# Patient Record
Sex: Female | Born: 1965
Health system: Southern US, Community
[De-identification: ages and names within clinical notes are randomized; demographics above are authoritative.]

## PROBLEM LIST (undated history)

## (undated) DIAGNOSIS — E119 Type 2 diabetes mellitus without complications: Secondary | ICD-10-CM

## (undated) DIAGNOSIS — I10 Essential (primary) hypertension: Secondary | ICD-10-CM

---

## 1999-01-05 ENCOUNTER — Other Ambulatory Visit: Admission: RE | Admit: 1999-01-05 | Discharge: 1999-01-05 | Payer: Self-pay | Admitting: Gynecology

## 2000-05-05 ENCOUNTER — Other Ambulatory Visit: Admission: RE | Admit: 2000-05-05 | Discharge: 2000-05-05 | Payer: Self-pay | Admitting: Family Medicine

## 2001-10-09 ENCOUNTER — Encounter: Payer: Self-pay | Admitting: Emergency Medicine

## 2001-10-09 ENCOUNTER — Inpatient Hospital Stay (HOSPITAL_COMMUNITY): Admission: EM | Admit: 2001-10-09 | Discharge: 2001-10-24 | Payer: Self-pay | Admitting: *Deleted

## 2001-10-09 ENCOUNTER — Encounter: Payer: Self-pay | Admitting: Neurosurgery

## 2001-10-10 ENCOUNTER — Encounter: Payer: Self-pay | Admitting: Neurosurgery

## 2001-10-15 ENCOUNTER — Encounter: Payer: Self-pay | Admitting: Neurosurgery

## 2001-10-22 ENCOUNTER — Encounter: Payer: Self-pay | Admitting: Neurosurgery

## 2002-01-05 ENCOUNTER — Encounter: Payer: Self-pay | Admitting: Neurosurgery

## 2002-01-05 ENCOUNTER — Encounter: Admission: RE | Admit: 2002-01-05 | Discharge: 2002-01-05 | Payer: Self-pay | Admitting: Neurosurgery

## 2002-01-19 ENCOUNTER — Inpatient Hospital Stay (HOSPITAL_COMMUNITY): Admission: RE | Admit: 2002-01-19 | Discharge: 2002-01-22 | Payer: Self-pay | Admitting: Neurosurgery

## 2002-01-19 ENCOUNTER — Encounter: Payer: Self-pay | Admitting: Neurosurgery

## 2002-01-20 ENCOUNTER — Encounter: Payer: Self-pay | Admitting: Neurosurgery

## 2005-05-29 ENCOUNTER — Other Ambulatory Visit: Admission: RE | Admit: 2005-05-29 | Discharge: 2005-05-29 | Payer: Self-pay | Admitting: Gynecology

## 2005-10-24 ENCOUNTER — Ambulatory Visit (HOSPITAL_BASED_OUTPATIENT_CLINIC_OR_DEPARTMENT_OTHER): Admission: RE | Admit: 2005-10-24 | Discharge: 2005-10-24 | Payer: Self-pay | Admitting: Gynecology

## 2013-09-23 ENCOUNTER — Encounter (HOSPITAL_COMMUNITY): Payer: Self-pay | Admitting: Emergency Medicine

## 2013-09-23 ENCOUNTER — Emergency Department (HOSPITAL_COMMUNITY)
Admission: EM | Admit: 2013-09-23 | Discharge: 2013-09-23 | Disposition: A | Discharge status: Home / Self Care | Payer: 59 | Source: Home / Self Care | Attending: Family Medicine | Admitting: Family Medicine

## 2013-09-23 DIAGNOSIS — S139XXA Sprain of joints and ligaments of unspecified parts of neck, initial encounter: Secondary | ICD-10-CM

## 2013-09-23 DIAGNOSIS — S161XXA Strain of muscle, fascia and tendon at neck level, initial encounter: Secondary | ICD-10-CM

## 2013-09-23 MED ORDER — METHOCARBAMOL 500 MG PO TABS: 500.0000 mg | ORAL_TABLET | Freq: Two times a day (BID) | ORAL | Status: DC

## 2013-09-23 MED ORDER — MELOXICAM 7.5 MG PO TABS: 7.5000 mg | ORAL_TABLET | Freq: Two times a day (BID) | ORAL | Status: DC

## 2013-09-23 NOTE — ED Notes (Addendum)
C/o neck pain radiating to bilateral shoulders and arms.  Patient job does consistent of looking down while standing.  Patient went to urgent care on [address] and received muscle relaxer which did not work.

## 2013-09-23 NOTE — ED Provider Notes (Signed)
CSN: [id number]     Arrival date & time 09/23/13  1914 History   First MD Initiated Contact with Patient 09/23/13 1956     Chief Complaint  Patient presents with  . Neck Pain   (Consider location/radiation/quality/duration/timing/severity/associated sxs/prior Treatment) Patient is a 47 y.o. female presenting with neck pain. The history is provided by the patient and the spouse.  Neck Pain Pain location:  Occipital region Quality:  Burning and aching Pain radiates to:  L shoulder and R shoulder Pain severity:  Moderate Onset quality:  Gradual Duration:  1 month Chronicity:  Chronic Context comment:  Looking down for hrs on job Associated symptoms: no numbness     History reviewed. No pertinent past medical history. No past surgical history on file. No family history on file. History  Substance Use Topics  . Smoking status: Not on file  . Smokeless tobacco: Not on file  . Alcohol Use: Not on file   OB History   Grav Para Term Preterm Abortions TAB SAB Ect Mult Living                 Review of Systems  Constitutional: Negative.   HENT: Positive for neck pain and neck stiffness.   Neurological: Negative for numbness.    Allergies  Review of patient's allergies indicates no known allergies.  Home Medications   Current Outpatient Rx  Name  Route  Sig  Dispense  Refill  . meloxicam (MOBIC) 7.5 MG tablet   Oral   Take 1 tablet (7.5 mg total) by mouth 2 (two) times daily after a meal.   30 tablet   1   . methocarbamol (ROBAXIN) 500 MG tablet   Oral   Take 1 tablet (500 mg total) by mouth 2 (two) times daily.   20 tablet   0    BP 163/95  Pulse 79  Temp(Src) 98.4 F (36.9 C) (Oral)  Resp 18  SpO2 98%  LMP 09/01/2013 Physical Exam  Nursing note and vitals reviewed. Constitutional: She is oriented to person, place, and time. She appears well-developed and well-nourished.  Neck: Muscular tenderness present. No spinous process tenderness present. Carotid bruit  is not present. No rigidity.  Musculoskeletal: Normal range of motion.  Lymphadenopathy:    She has no cervical adenopathy.  Neurological: She is alert and oriented to person, place, and time.  Skin: Skin is warm and dry.    ED Course  Procedures (including critical care time) Labs Review Labs Reviewed - No data to display Imaging Review No results found.  MDM      Linna Hoff, MD 09/23/13 2023

## 2014-06-06 ENCOUNTER — Encounter (HOSPITAL_COMMUNITY): Payer: Self-pay | Admitting: Emergency Medicine

## 2014-06-06 ENCOUNTER — Emergency Department (HOSPITAL_COMMUNITY)
Admission: EM | Admit: 2014-06-06 | Discharge: 2014-06-06 | Disposition: A | Discharge status: Home / Self Care | Payer: 59 | Source: Home / Self Care | Attending: Family Medicine | Admitting: Family Medicine

## 2014-06-06 DIAGNOSIS — R059 Cough, unspecified: Secondary | ICD-10-CM

## 2014-06-06 DIAGNOSIS — R05 Cough: Secondary | ICD-10-CM

## 2014-06-06 MED ORDER — AEROCHAMBER PLUS FLO-VU LARGE MISC: 1.0000 | Freq: Once | Status: AC

## 2014-06-06 MED ORDER — PREDNISONE 10 MG PO TABS: ORAL_TABLET | ORAL | Status: DC

## 2014-06-06 MED ORDER — HYDROCOD POLST-CHLORPHEN POLST 10-8 MG/5ML PO LQCR: 5.0000 mL | Freq: Every evening | ORAL | Status: DC | PRN

## 2014-06-06 MED ORDER — ALBUTEROL SULFATE HFA 108 (90 BASE) MCG/ACT IN AERS: 2.0000 | INHALATION_SPRAY | RESPIRATORY_TRACT | Status: AC | PRN

## 2014-06-06 NOTE — ED Provider Notes (Signed)
CSN: [id number]     Arrival date & time 06/06/14  1743 History   First MD Initiated Contact with Patient 06/06/14 1840     Chief Complaint  Patient presents with  . Cough   (Consider location/radiation/quality/duration/timing/severity/associated sxs/prior Treatment) HPI Comments: 48 year old female with no significant past medical history presents for evaluation of a cough. 4 days, she has had a dry cough that is worse at night. She has been coughing so much that now she has sore throat with coughing as well as pain in her chest and abdomen with coughing she has no pain in her chest or abdomen otherwise. She denies any shortness of breath or recent travel. She has no history of DVT or PE and no history of pneumonia. She does not feel sick, she just feels like she can't quit coughing  Patient is a 48 y.o. female presenting with cough.  Cough Associated symptoms: chest pain (Only with coughing) and sore throat (Only with coughing)   Associated symptoms: no chills and no fever     History reviewed. No pertinent past medical history. History reviewed. No pertinent past surgical history. No family history on file. History  Substance Use Topics  . Smoking status: Passive Smoke Exposure - Never Smoker  . Smokeless tobacco: Not on file  . Alcohol Use: No   OB History   Grav Para Term Preterm Abortions TAB SAB Ect Mult Living                 Review of Systems  Constitutional: Negative for fever, chills and fatigue.  HENT: Positive for sore throat (Only with coughing).   Respiratory: Positive for cough.   Cardiovascular: Positive for chest pain (Only with coughing).  Gastrointestinal: Positive for abdominal pain (Only with coughing).  All other systems reviewed and are negative.   Allergies  Review of patient's allergies indicates no known allergies.  Home Medications   Prior to Admission medications   Medication Sig Start Date End Date Taking? Authorizing Provider  albuterol  (PROVENTIL HFA;VENTOLIN HFA) 108 (90 BASE) MCG/ACT inhaler Inhale 2 puffs into the lungs every 4 (four) hours as needed for wheezing. 06/06/14   Graylon GoodZachary H Sharnelle Cappelli, PA-C  chlorpheniramine-HYDROcodone (TUSSIONEX PENNKINETIC ER) 10-8 MG/5ML LQCR Take 5 mLs by mouth at bedtime as needed for cough. 06/06/14   Graylon GoodZachary H Ova Meegan, PA-C  meloxicam (MOBIC) 7.5 MG tablet Take 1 tablet (7.5 mg total) by mouth 2 (two) times daily after a meal. 09/23/13   Linna HoffJames D Kindl, MD  methocarbamol (ROBAXIN) 500 MG tablet Take 1 tablet (500 mg total) by mouth 2 (two) times daily. 09/23/13   Linna HoffJames D Kindl, MD  predniSONE (DELTASONE) 10 MG tablet 4 tabs PO QD for 4 days; 3 tabs PO QD for 3 days; 2 tabs PO QD for 2 days; 1 tab PO QD for 1 day 06/06/14   Graylon GoodZachary H Kushal Saunders, PA-C  Spacer/Aero-Holding Chambers (AEROCHAMBER PLUS FLO-VU LARGE) MISC 1 each by Other route once. 06/06/14   Adrian BlackwaterZachary H Cardin Nitschke, PA-C   BP 143/84  Pulse 80  Temp(Src) 98.6 F (37 C) (Oral)  Resp 14  SpO2 100%  LMP 05/01/2014 Physical Exam  Nursing note and vitals reviewed. Constitutional: She is oriented to person, place, and time. Vital signs are normal. She appears well-developed and well-nourished. No distress.  HENT:  Head: Normocephalic and atraumatic.  Right Ear: External ear normal.  Left Ear: External ear normal.  Mouth/Throat: Oropharynx is clear and moist. No oropharyngeal exudate.  Eyes: Conjunctivae are  normal. Right eye exhibits no discharge. Left eye exhibits no discharge.  Neck: Normal range of motion. Neck supple.  Cardiovascular: Normal rate, regular rhythm, normal heart sounds and intact distal pulses.  Exam reveals no gallop and no friction rub.   No murmur heard. Pulmonary/Chest: Breath sounds normal. No respiratory distress. She has no wheezes. She has no rales. She exhibits no tenderness.  Abdominal: Soft. She exhibits no distension and no mass. There is no tenderness. There is no rebound and no guarding.  Lymphadenopathy:    She has no  cervical adenopathy.  Neurological: She is alert and oriented to person, place, and time. She has normal strength. Coordination normal.  Skin: Skin is warm and dry. No rash noted. She is not diaphoretic.  Psychiatric: She has a normal mood and affect. Judgment normal.    ED Course  Procedures (including critical care time) Labs Review Labs Reviewed - No data to display  Imaging Review No results found.   MDM   1. Cough    Viral upper respiratory infection. Treat symptomatically. Followup if she has any worsening for reassessment.  Meds ordered this encounter  Medications  . predniSONE (DELTASONE) 10 MG tablet    Sig: 4 tabs PO QD for 4 days; 3 tabs PO QD for 3 days; 2 tabs PO QD for 2 days; 1 tab PO QD for 1 day    Dispense:  30 tablet    Refill:  0    Order Specific Question:  Supervising Provider    Answer:  Linna HoffKINDL, JAMES D [phone][[id number]]  . albuterol (PROVENTIL HFA;VENTOLIN HFA) 108 (90 BASE) MCG/ACT inhaler    Sig: Inhale 2 puffs into the lungs every 4 (four) hours as needed for wheezing.    Dispense:  1 Inhaler    Refill:  0    Order Specific Question:  Supervising Provider    Answer:  Bradd CanaryKINDL, JAMES D [phone][[id number]]  . Spacer/Aero-Holding Chambers (AEROCHAMBER PLUS FLO-VU LARGE) MISC    Sig: 1 each by Other route once.    Dispense:  1 each    Refill:  0    Order Specific Question:  Supervising Provider    Answer:  Linna HoffKINDL, JAMES D [phone][[id number]]  . chlorpheniramine-HYDROcodone (TUSSIONEX PENNKINETIC ER) 10-8 MG/5ML LQCR    Sig: Take 5 mLs by mouth at bedtime as needed for cough.    Dispense:  115 mL    Refill:  0    Order Specific Question:  Supervising Provider    Answer:  Bradd CanaryKINDL, JAMES D [[id number]]   \    Graylon GoodZachary H Montez Stryker, PA-C 06/06/14 1910

## 2014-06-06 NOTE — Discharge Instructions (Signed)

## 2014-06-06 NOTE — ED Notes (Signed)
Patient c/o dry cough x 4 days. Patient has been taking OTC cough drops and medication with no relief. Patient reports she has pain in her ribs and cough is worse at night. Patient is alert and oriented and in no acute distress.

## 2014-06-07 NOTE — ED Provider Notes (Signed)
Medical screening examination/treatment/procedure(s) were performed by resident physician or non-physician practitioner and as supervising physician I was immediately available for consultation/collaboration.   Jhoanna Heyde DOUGLAS MD.   Temiloluwa [place] D Finlee Concepcion, MD 06/07/14 1718 

## 2014-06-23 ENCOUNTER — Encounter (HOSPITAL_COMMUNITY): Payer: Self-pay | Admitting: Emergency Medicine

## 2014-06-23 ENCOUNTER — Emergency Department (INDEPENDENT_AMBULATORY_CARE_PROVIDER_SITE_OTHER): Payer: 59

## 2014-06-23 ENCOUNTER — Emergency Department (HOSPITAL_COMMUNITY)
Admission: EM | Admit: 2014-06-23 | Discharge: 2014-06-23 | Disposition: A | Discharge status: Home / Self Care | Payer: 59 | Source: Home / Self Care | Attending: Family Medicine | Admitting: Family Medicine

## 2014-06-23 DIAGNOSIS — R05 Cough: Secondary | ICD-10-CM

## 2014-06-23 DIAGNOSIS — R059 Cough, unspecified: Secondary | ICD-10-CM

## 2014-06-23 MED ORDER — IPRATROPIUM-ALBUTEROL 0.5-2.5 (3) MG/3ML IN SOLN
RESPIRATORY_TRACT | Status: AC
Start: 2014-06-23 — End: 2014-06-23
  Filled 2014-06-23: qty 3

## 2014-06-23 MED ORDER — HYDROCOD POLST-CHLORPHEN POLST 10-8 MG/5ML PO LQCR: 5.0000 mL | Freq: Every evening | ORAL | Status: DC | PRN

## 2014-06-23 MED ORDER — AZITHROMYCIN 250 MG PO TABS: 250.0000 mg | ORAL_TABLET | Freq: Every day | ORAL | Status: DC

## 2014-06-23 MED ORDER — OMEPRAZOLE 40 MG PO CPDR: 40.0000 mg | DELAYED_RELEASE_CAPSULE | Freq: Every day | ORAL | Status: AC

## 2014-06-23 NOTE — ED Provider Notes (Signed)
Jacqueline Pollard is a 48 y.o. female who presents to Urgent Care today for cough. Patient is a 4 week history of cough and congestion. She was seen on June 15 and prescribed prednisone albuterol and hydrocodone containing cough medication. This is not helped. She notes a few episodes of posttussive vomiting. She denies any significant shortness of breath or wheezing. No fevers or chills or palpitations.   History reviewed. No pertinent past medical history. History  Substance Use Topics  . Smoking status: Passive Smoke Exposure - Never Smoker  . Smokeless tobacco: Not on file  . Alcohol Use: No   ROS as above Medications: No current facility-administered medications for this encounter.   Current Outpatient Prescriptions  Medication Sig Dispense Refill  . albuterol (PROVENTIL HFA;VENTOLIN HFA) 108 (90 BASE) MCG/ACT inhaler Inhale 2 puffs into the lungs every 4 (four) hours as needed for wheezing.  1 Inhaler  0  . chlorpheniramine-HYDROcodone (TUSSIONEX PENNKINETIC ER) 10-8 MG/5ML LQCR Take 5 mLs by mouth at bedtime as needed for cough.  115 mL  0  . meloxicam (MOBIC) 7.5 MG tablet Take 1 tablet (7.5 mg total) by mouth 2 (two) times daily after a meal.  30 tablet  1  . methocarbamol (ROBAXIN) 500 MG tablet Take 1 tablet (500 mg total) by mouth 2 (two) times daily.  20 tablet  0  . predniSONE (DELTASONE) 10 MG tablet 4 tabs PO QD for 4 days; 3 tabs PO QD for 3 days; 2 tabs PO QD for 2 days; 1 tab PO QD for 1 day  30 tablet  0  . Spacer/Aero-Holding Chambers (AEROCHAMBER PLUS FLO-VU LARGE) MISC 1 each by Other route once.  1 each  0    Exam:  BP 135/90  Pulse 81  Temp(Src) 98.7 F (37.1 C) (Oral)  Resp 16  SpO2 96%  LMP 06/02/2014 Gen: Well NAD HEENT: EOMI,  MMM posterior pharynx with cobblestoning normal tympanic membranes bilaterally Lungs: Normal work of breathing. CTABL Heart: RRR no MRG Abd: NABS, Soft. NT, ND Exts: Brisk capillary refill, warm and well perfused.   No results  found for this or any previous visit (from the past 24 hour(s)). Dg Chest 2 View  06/23/2014   CLINICAL DATA:  Two-day history of shortness of breath with recent cough.  EXAM: CHEST  2 VIEW  COMPARISON:  None.  FINDINGS: The lungs are well-expanded and clear. The heart and mediastinal structures are normal. There is no pleural effusion. The bony thorax is unremarkable.  IMPRESSION: There is no acute cardiopulmonary abnormality.   Electronically Signed   By: David  [place]azil[place]   On: 06/23/2014 13:21    Assessment and Plan: 48 y.o. female with cough. Unclear etiology. Prednisone and albuterol that were previously. We'll try azithromycin, and omeprazole. Patient declined nasal spray. Refill Tussionex  Discussed warning signs or symptoms. Please see discharge instructions. Patient expresses understanding.    Rodolph BongEvan S Corey, MD 06/23/14 [phone]

## 2014-06-23 NOTE — Discharge Instructions (Signed)
Thank you for coming in today. °Call or go to the emergency room if you get worse, have trouble breathing, have chest pains, or palpitations.  ° ° °Cough, Adult ° A cough is a reflex that helps clear your throat and airways. It can help heal the body or may be a reaction to an irritated airway. A cough may only last 2 or 3 weeks (acute) or may last more than 8 weeks (chronic).  °CAUSES °Acute cough: °· Viral or bacterial infections. °Chronic cough: °· Infections. °· Allergies. °· Asthma. °· Post-nasal drip. °· Smoking. °· Heartburn or acid reflux. °· Some medicines. °· Chronic lung problems (COPD). °· Cancer. °SYMPTOMS  °· Cough. °· Fever. °· Chest pain. °· Increased breathing rate. °· High-pitched whistling sound when breathing (wheezing). °· Colored mucus that you cough up (sputum). °TREATMENT  °· A bacterial cough may be treated with antibiotic medicine. °· A viral cough must run its course and will not respond to antibiotics. °· Your caregiver may recommend other treatments if you have a chronic cough. °HOME CARE INSTRUCTIONS  °· Only take over-the-counter or prescription medicines for pain, discomfort, or fever as directed by your caregiver. Use cough suppressants only as directed by your caregiver. °· Use a cold steam vaporizer or humidifier in your bedroom or home to help loosen secretions. °· Sleep in a semi-upright position if your cough is worse at night. °· Rest as needed. °· Stop smoking if you smoke. °SEEK IMMEDIATE MEDICAL CARE IF:  °· You have pus in your sputum. °· Your cough starts to worsen. °· You cannot control your cough with suppressants and are losing sleep. °· You begin coughing up blood. °· You have difficulty breathing. °· You develop pain which is getting worse or is uncontrolled with medicine. °· You have a fever. °MAKE SURE YOU:  °· Understand these instructions. °· Will watch your condition. °· Will get help right away if you are not doing well or get worse. °Document Released:  06/07/2011 Document Revised: 03/02/2012 Document Reviewed: 06/07/2011 °Exit[employer]® Patient Information ©2015 [employer]. This information is not intended to replace advice given to you by your health care provider. Make sure you discuss any questions you have with your health care provider. ° °

## 2014-06-23 NOTE — ED Notes (Signed)
C/o sob due to coughing States inhaler and codeine was used as tx  States she did vomit when she cough so much Did finish prednisone  States cough up white mucous

## 2016-02-07 IMAGING — CR DG CHEST 2V
2 series · 2 of 2 positions shown · non-contrast
Comparison: None.

CLINICAL DATA: Two-day history of shortness of breath with recent
cough.

EXAM:
CHEST  2 VIEW

[view not recorded (1 of 2)]
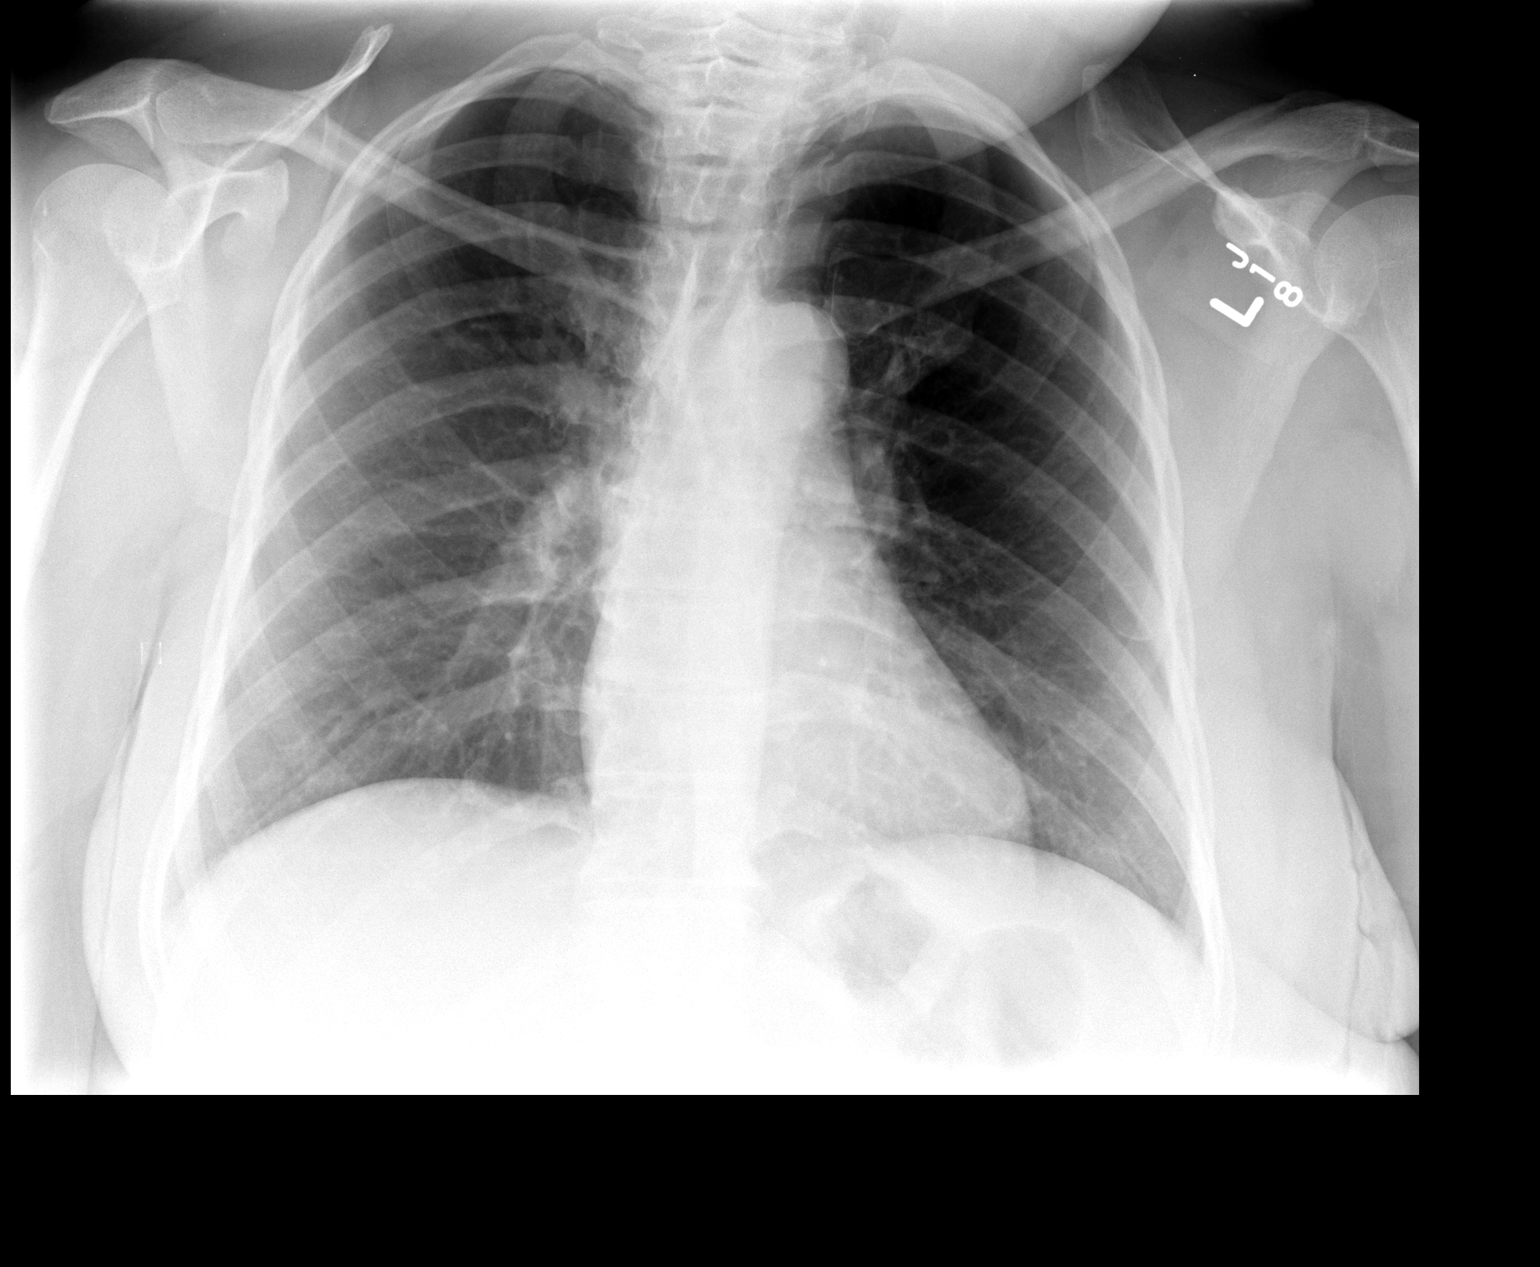

[view not recorded (2 of 2)]
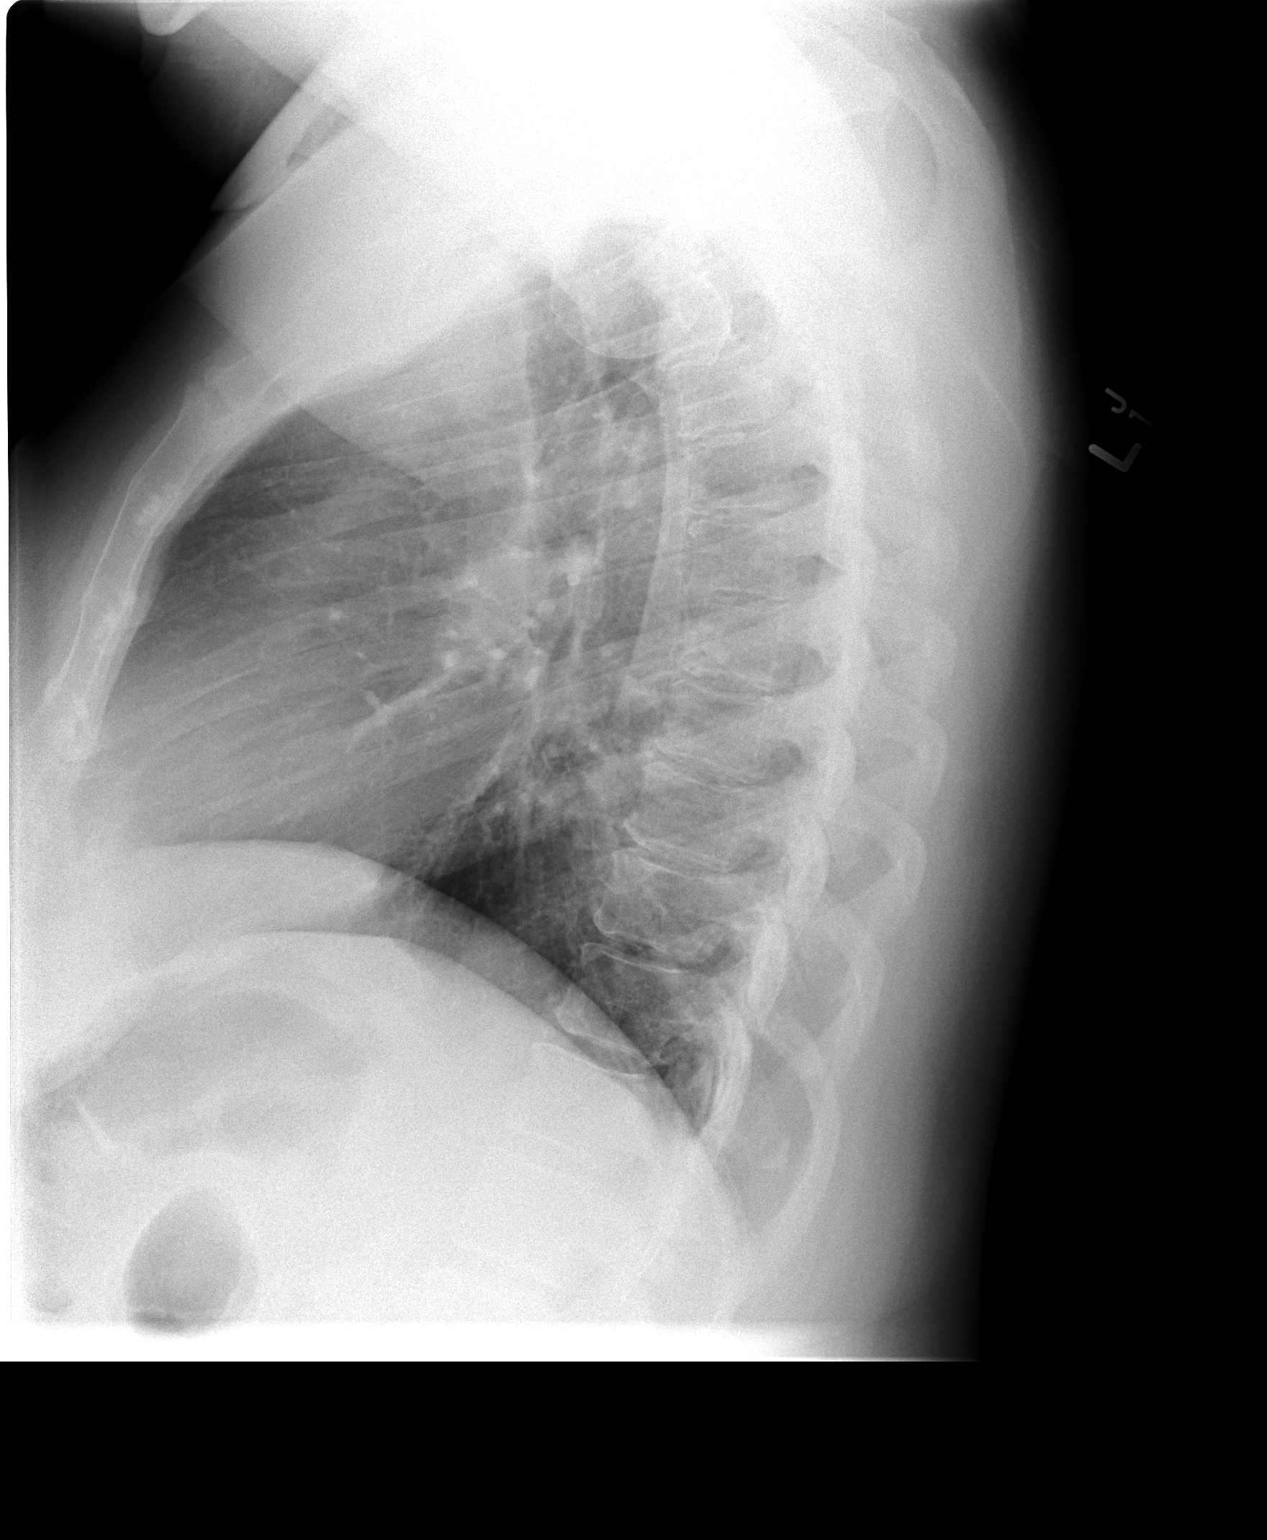

[2 of 2 positions shown; findings below may reference images not displayed]

FINDINGS: The lungs are well-expanded and clear. The heart and mediastinal
structures are normal. There is no pleural effusion. The bony thorax
is unremarkable.
IMPRESSION: There is no acute cardiopulmonary abnormality.

## 2017-01-17 DIAGNOSIS — Z1231 Encounter for screening mammogram for malignant neoplasm of breast: Secondary | ICD-10-CM | POA: Diagnosis not present

## 2017-01-24 DIAGNOSIS — M25511 Pain in right shoulder: Secondary | ICD-10-CM | POA: Diagnosis not present

## 2017-01-24 DIAGNOSIS — M25711 Osteophyte, right shoulder: Secondary | ICD-10-CM | POA: Diagnosis not present

## 2017-07-01 DIAGNOSIS — R609 Edema, unspecified: Secondary | ICD-10-CM | POA: Diagnosis not present

## 2017-07-10 DIAGNOSIS — M722 Plantar fascial fibromatosis: Secondary | ICD-10-CM | POA: Diagnosis not present

## 2017-07-10 DIAGNOSIS — M76821 Posterior tibial tendinitis, right leg: Secondary | ICD-10-CM | POA: Diagnosis not present

## 2017-07-10 DIAGNOSIS — M71571 Other bursitis, not elsewhere classified, right ankle and foot: Secondary | ICD-10-CM | POA: Diagnosis not present

## 2017-07-10 DIAGNOSIS — M7731 Calcaneal spur, right foot: Secondary | ICD-10-CM | POA: Diagnosis not present

## 2017-07-10 DIAGNOSIS — M71572 Other bursitis, not elsewhere classified, left ankle and foot: Secondary | ICD-10-CM | POA: Diagnosis not present

## 2017-07-10 DIAGNOSIS — M19072 Primary osteoarthritis, left ankle and foot: Secondary | ICD-10-CM | POA: Diagnosis not present

## 2017-07-10 DIAGNOSIS — M76822 Posterior tibial tendinitis, left leg: Secondary | ICD-10-CM | POA: Diagnosis not present

## 2017-07-10 DIAGNOSIS — M19071 Primary osteoarthritis, right ankle and foot: Secondary | ICD-10-CM | POA: Diagnosis not present

## 2017-07-25 DIAGNOSIS — M722 Plantar fascial fibromatosis: Secondary | ICD-10-CM | POA: Diagnosis not present

## 2017-07-25 DIAGNOSIS — M76822 Posterior tibial tendinitis, left leg: Secondary | ICD-10-CM | POA: Diagnosis not present

## 2017-07-25 DIAGNOSIS — M76821 Posterior tibial tendinitis, right leg: Secondary | ICD-10-CM | POA: Diagnosis not present

## 2017-09-03 DIAGNOSIS — R7303 Prediabetes: Secondary | ICD-10-CM | POA: Diagnosis not present

## 2017-09-03 DIAGNOSIS — I10 Essential (primary) hypertension: Secondary | ICD-10-CM | POA: Diagnosis not present

## 2017-09-03 DIAGNOSIS — Z6835 Body mass index (BMI) 35.0-35.9, adult: Secondary | ICD-10-CM | POA: Diagnosis not present

## 2017-09-03 DIAGNOSIS — R635 Abnormal weight gain: Secondary | ICD-10-CM | POA: Diagnosis not present

## 2018-03-06 DIAGNOSIS — E119 Type 2 diabetes mellitus without complications: Secondary | ICD-10-CM | POA: Diagnosis not present

## 2018-03-06 DIAGNOSIS — I10 Essential (primary) hypertension: Secondary | ICD-10-CM | POA: Diagnosis not present

## 2018-03-06 DIAGNOSIS — Z0000 Encounter for general adult medical examination without abnormal findings: Secondary | ICD-10-CM | POA: Diagnosis not present

## 2018-03-06 DIAGNOSIS — Z6835 Body mass index (BMI) 35.0-35.9, adult: Secondary | ICD-10-CM | POA: Diagnosis not present

## 2018-03-06 DIAGNOSIS — Z23 Encounter for immunization: Secondary | ICD-10-CM | POA: Diagnosis not present

## 2018-03-06 DIAGNOSIS — R635 Abnormal weight gain: Secondary | ICD-10-CM | POA: Diagnosis not present

## 2018-03-20 DIAGNOSIS — Z1231 Encounter for screening mammogram for malignant neoplasm of breast: Secondary | ICD-10-CM | POA: Diagnosis not present

## 2018-04-01 DIAGNOSIS — I10 Essential (primary) hypertension: Secondary | ICD-10-CM | POA: Diagnosis not present

## 2018-04-01 DIAGNOSIS — E119 Type 2 diabetes mellitus without complications: Secondary | ICD-10-CM | POA: Diagnosis not present

## 2018-04-17 DIAGNOSIS — Z01818 Encounter for other preprocedural examination: Secondary | ICD-10-CM | POA: Diagnosis not present

## 2018-04-17 DIAGNOSIS — Z1211 Encounter for screening for malignant neoplasm of colon: Secondary | ICD-10-CM | POA: Diagnosis not present

## 2018-05-08 DIAGNOSIS — H35033 Hypertensive retinopathy, bilateral: Secondary | ICD-10-CM | POA: Diagnosis not present

## 2018-05-08 DIAGNOSIS — I7090 Unspecified atherosclerosis: Secondary | ICD-10-CM | POA: Diagnosis not present

## 2018-05-08 DIAGNOSIS — H01009 Unspecified blepharitis unspecified eye, unspecified eyelid: Secondary | ICD-10-CM | POA: Diagnosis not present

## 2018-05-29 DIAGNOSIS — K5730 Diverticulosis of large intestine without perforation or abscess without bleeding: Secondary | ICD-10-CM | POA: Diagnosis not present

## 2018-05-29 DIAGNOSIS — Z1211 Encounter for screening for malignant neoplasm of colon: Secondary | ICD-10-CM | POA: Diagnosis not present

## 2018-07-03 ENCOUNTER — Other Ambulatory Visit: Payer: Self-pay | Admitting: Obstetrics and Gynecology

## 2018-07-03 ENCOUNTER — Other Ambulatory Visit (HOSPITAL_COMMUNITY)
Admission: RE | Admit: 2018-07-03 | Discharge: 2018-07-03 | Disposition: A | Discharge status: Home / Self Care | Payer: BLUE CROSS/BLUE SHIELD | Source: Ambulatory Visit | Attending: Obstetrics and Gynecology | Admitting: Obstetrics and Gynecology

## 2018-07-03 DIAGNOSIS — N852 Hypertrophy of uterus: Secondary | ICD-10-CM | POA: Diagnosis not present

## 2018-07-03 DIAGNOSIS — Z01419 Encounter for gynecological examination (general) (routine) without abnormal findings: Secondary | ICD-10-CM | POA: Diagnosis not present

## 2018-07-03 DIAGNOSIS — Z01411 Encounter for gynecological examination (general) (routine) with abnormal findings: Secondary | ICD-10-CM | POA: Diagnosis not present

## 2018-07-03 DIAGNOSIS — N761 Subacute and chronic vaginitis: Secondary | ICD-10-CM | POA: Diagnosis not present

## 2018-07-03 DIAGNOSIS — N951 Menopausal and female climacteric states: Secondary | ICD-10-CM | POA: Diagnosis not present

## 2018-07-06 LAB — CYTOLOGY - PAP
Diagnosis: NEGATIVE
HPV: NOT DETECTED

## 2018-07-10 DIAGNOSIS — N852 Hypertrophy of uterus: Secondary | ICD-10-CM | POA: Diagnosis not present

## 2018-08-20 DIAGNOSIS — N859 Noninflammatory disorder of uterus, unspecified: Secondary | ICD-10-CM | POA: Diagnosis not present

## 2018-09-17 DIAGNOSIS — Z780 Asymptomatic menopausal state: Secondary | ICD-10-CM | POA: Diagnosis not present

## 2019-04-06 DIAGNOSIS — I10 Essential (primary) hypertension: Secondary | ICD-10-CM | POA: Diagnosis not present

## 2019-04-06 DIAGNOSIS — E119 Type 2 diabetes mellitus without complications: Secondary | ICD-10-CM | POA: Diagnosis not present

## 2019-06-04 DIAGNOSIS — I10 Essential (primary) hypertension: Secondary | ICD-10-CM | POA: Diagnosis not present

## 2019-06-04 DIAGNOSIS — E1169 Type 2 diabetes mellitus with other specified complication: Secondary | ICD-10-CM | POA: Diagnosis not present

## 2019-06-04 DIAGNOSIS — Z0000 Encounter for general adult medical examination without abnormal findings: Secondary | ICD-10-CM | POA: Diagnosis not present

## 2019-06-04 DIAGNOSIS — E119 Type 2 diabetes mellitus without complications: Secondary | ICD-10-CM | POA: Diagnosis not present

## 2019-06-04 DIAGNOSIS — Z1239 Encounter for other screening for malignant neoplasm of breast: Secondary | ICD-10-CM | POA: Diagnosis not present

## 2019-07-02 DIAGNOSIS — H524 Presbyopia: Secondary | ICD-10-CM | POA: Diagnosis not present

## 2019-07-02 DIAGNOSIS — H35013 Changes in retinal vascular appearance, bilateral: Secondary | ICD-10-CM | POA: Diagnosis not present

## 2019-07-02 DIAGNOSIS — H43393 Other vitreous opacities, bilateral: Secondary | ICD-10-CM | POA: Diagnosis not present

## 2019-07-02 DIAGNOSIS — H2513 Age-related nuclear cataract, bilateral: Secondary | ICD-10-CM | POA: Diagnosis not present

## 2019-07-02 DIAGNOSIS — H35033 Hypertensive retinopathy, bilateral: Secondary | ICD-10-CM | POA: Diagnosis not present

## 2019-07-09 ENCOUNTER — Other Ambulatory Visit: Payer: Self-pay

## 2019-07-09 ENCOUNTER — Ambulatory Visit (HOSPITAL_COMMUNITY)
Admission: EM | Admit: 2019-07-09 | Discharge: 2019-07-09 | Disposition: A | Discharge status: Home / Self Care | Payer: BC Managed Care – PPO

## 2019-07-09 ENCOUNTER — Encounter (HOSPITAL_COMMUNITY): Payer: Self-pay

## 2019-07-09 DIAGNOSIS — G8929 Other chronic pain: Secondary | ICD-10-CM | POA: Diagnosis not present

## 2019-07-09 DIAGNOSIS — M25512 Pain in left shoulder: Secondary | ICD-10-CM

## 2019-07-09 MED ORDER — METHYLPREDNISOLONE ACETATE 40 MG/ML IJ SUSP
INTRAMUSCULAR | Status: AC
  Filled 2019-07-09: qty 1

## 2019-07-09 MED ORDER — METHYLPREDNISOLONE ACETATE 40 MG/ML IJ SUSP: 10.0000 mg | Freq: Once | INTRAMUSCULAR | Status: DC

## 2019-07-09 MED ORDER — METHYLPREDNISOLONE ACETATE 40 MG/ML IJ SUSP
40.0000 mg | Freq: Once | INTRAMUSCULAR | Status: AC
  Administered 2019-07-09: 40 mg via INTRAMUSCULAR

## 2019-07-09 NOTE — ED Triage Notes (Signed)
Pt presents with left shoulder and neck pain X 5 days.

## 2019-07-09 NOTE — ED Provider Notes (Signed)
[facility]    CSN: [id number] Arrival date & time: 07/09/19  [id number]     History   Chief Complaint Chief Complaint  Patient presents with  . Shoulder Pain  . Neck Pain    HPI Jacqueline Pollard is a 53 y.o. female with history of diabetes and GERD presenting for 1 month of left shoulder and neck pain.  Patient states this is been intermittent for the last month, though worse over the last 5 days.  No inciting event, trauma, fall to the area.  Patient states that it is from the top of her shoulder to the base of her neck.  Patient works as a Biomedical scientistsous chef, is able to perform her job duties despite pain, though has had some difficulty raising her arm overhead.  Denies headache, upper extremity weakness, paresthesias.  Patient took Tylenol a few times for this with mild relief of symptoms.  Has not tried NSAIDs, heating, ice.    History reviewed. No pertinent past medical history.  There are no active problems to display for this patient.   History reviewed. No pertinent surgical history.  OB History   No obstetric history on file.      Home Medications    Prior to Admission medications   Medication Sig Start Date End Date Taking? Authorizing Provider  metFORMIN (GLUCOPHAGE) 500 MG tablet Take 500 mg by mouth daily with breakfast.   Yes [provider]  albuterol (PROVENTIL HFA;VENTOLIN HFA) 108 (90 BASE) MCG/ACT inhaler Inhale 2 puffs into the lungs every 4 (four) hours as needed for wheezing. 06/06/14   Excell SeltzerBaker, Adrian BlackwaterZachary H, PA-C  omeprazole (PRILOSEC) 40 MG capsule Take 1 capsule (40 mg total) by mouth daily. 06/23/14   Rodolph Bongorey, Evan S, MD  Spacer/Aero-Holding Chambers (AEROCHAMBER PLUS FLO-VU LARGE) MISC 1 each by Other route once. 06/06/14   Graylon GoodBaker, Zachary H, PA-C    Family History Family History  Family history unknown: Yes    Social History Social History   Tobacco Use  . Smoking status: Passive Smoke Exposure - Never Smoker  Substance Use Topics  .  Alcohol use: No  . Drug use: Not on file     Allergies   Patient has no known allergies.   Review of Systems Review of Systems  Constitutional: Negative for fever and malaise/fatigue.  Respiratory: Negative for cough and shortness of breath.   Cardiovascular: Negative for chest pain and palpitations.  Gastrointestinal: Negative for abdominal pain and nausea.  Musculoskeletal: Positive for joint pain, myalgias and neck pain. Negative for back pain.  Skin: Negative for itching and rash.  Neurological: Negative for dizziness, tingling, tremors, sensory change, speech change, focal weakness, seizures, loss of consciousness, weakness and headaches.     Physical Exam Triage Vital Signs ED Triage Vitals  Enc Vitals Group     BP 07/09/19 1048 (!) 146/85     Pulse Rate 07/09/19 1044 (!) 58     Resp 07/09/19 1044 17     Temp 07/09/19 1044 98.3 F (36.8 C)     Temp Source 07/09/19 1044 Oral     SpO2 07/09/19 1044 98 %     Weight --      Height --      Head Circumference --      Peak Flow --      Pain Score 07/09/19 1047 6     Pain Loc --      Pain Edu? --      Excl. in GC? --  No data found.  Updated Vital Signs BP (!) 146/85 (BP Location: Right Arm)   Pulse (!) 58   Temp 98.3 F (36.8 C) (Oral)   Resp 17   SpO2 98%   Visual Acuity Right Eye Distance:   Left Eye Distance:   Bilateral Distance:    Right Eye Near:   Left Eye Near:    Bilateral Near:     Physical Exam Constitutional:      General: She is not in acute distress. HENT:     Head: Normocephalic and atraumatic.  Eyes:     General: No scleral icterus.    Pupils: Pupils are equal, round, and reactive to light.  Cardiovascular:     Rate and Rhythm: Normal rate.  Pulmonary:     Effort: Pulmonary effort is normal.  Musculoskeletal:     Comments: No bony abnormality of the left shoulder.  No ecchymosis, erythema, rash.  Mildly tender to superior aspect of shoulder joint.  Decreased active range of  motion with abduction, extension.  Mild crepitus with movement.  5/5 strength, the patient does endorse some discomfort.  5-5 grip strength bilaterally.  NVI.  Negative impingement sign.  Skin:    Coloration: Skin is not jaundiced or pale.  Neurological:     Mental Status: She is alert and oriented to person, place, and time.      UC Treatments / Results  Labs (all labs ordered are listed, but only abnormal results are displayed) Labs Reviewed - No data to display  EKG   Radiology No results found.  Procedures Procedures (including critical care time)  Medications Ordered in UC Medications  methylPREDNISolone acetate (DEPO-MEDROL) injection 40 mg (has no administration in time range)  methylPREDNISolone acetate (DEPO-MEDROL) 40 MG/ML injection (has no administration in time range)    Initial Impression / Assessment and Plan / UC Course  I have reviewed the triage vital signs and the nursing notes.  Pertinent labs & imaging results that were available during my care of the patient were reviewed by me and considered in my medical decision making (see chart for details).     1.  Chronic left shoulder pain Acute on chronic.  Radiography deferred due to chronicity and lack of inciting event.  Given steroid injection in office which patient tolerated well.  Will take Aleve twice daily for 1 week and follow-up with sports medicine and/or PCP for further evaluation and management due to chronicity of complaint.  Return precautions discussed, patient verbalized understanding and is agreeable to plan. Final Clinical Impressions(s) / UC Diagnoses   Final diagnoses:  Chronic left shoulder pain     Discharge Instructions     Take aleve once with breakfast and again at dinner if needed for pain. Follow up with sports medicine for further evaluation if symptoms do not improve/worsen.    ED Prescriptions    None     Controlled Substance Prescriptions [place] Controlled Substance  Registry consulted? Not Applicable   Quincy Sheehan, [place] 07/11/19 1851

## 2019-07-09 NOTE — Discharge Instructions (Addendum)
Take aleve once with breakfast and again at dinner if needed for pain. Follow up with sports medicine for further evaluation if symptoms do not improve/worsen.

## 2019-07-23 DIAGNOSIS — Z1231 Encounter for screening mammogram for malignant neoplasm of breast: Secondary | ICD-10-CM | POA: Diagnosis not present

## 2019-08-27 ENCOUNTER — Other Ambulatory Visit: Payer: Self-pay | Admitting: Obstetrics and Gynecology

## 2019-08-27 ENCOUNTER — Other Ambulatory Visit (HOSPITAL_COMMUNITY)
Admission: RE | Admit: 2019-08-27 | Discharge: 2019-08-27 | Disposition: A | Discharge status: Home / Self Care | Payer: BC Managed Care – PPO | Source: Ambulatory Visit | Attending: Obstetrics and Gynecology | Admitting: Obstetrics and Gynecology

## 2019-08-27 DIAGNOSIS — Z124 Encounter for screening for malignant neoplasm of cervix: Secondary | ICD-10-CM | POA: Diagnosis not present

## 2019-08-27 DIAGNOSIS — Z01411 Encounter for gynecological examination (general) (routine) with abnormal findings: Secondary | ICD-10-CM | POA: Diagnosis not present

## 2019-09-01 LAB — CYTOLOGY - PAP
Diagnosis: NEGATIVE
HPV: NOT DETECTED

## 2020-01-07 DIAGNOSIS — I10 Essential (primary) hypertension: Secondary | ICD-10-CM | POA: Diagnosis not present

## 2020-01-07 DIAGNOSIS — E1169 Type 2 diabetes mellitus with other specified complication: Secondary | ICD-10-CM | POA: Diagnosis not present

## 2020-01-07 DIAGNOSIS — Z7984 Long term (current) use of oral hypoglycemic drugs: Secondary | ICD-10-CM | POA: Diagnosis not present

## 2020-06-09 DIAGNOSIS — Z0000 Encounter for general adult medical examination without abnormal findings: Secondary | ICD-10-CM | POA: Diagnosis not present

## 2020-06-09 DIAGNOSIS — E1169 Type 2 diabetes mellitus with other specified complication: Secondary | ICD-10-CM | POA: Diagnosis not present

## 2020-06-09 DIAGNOSIS — Z13220 Encounter for screening for lipoid disorders: Secondary | ICD-10-CM | POA: Diagnosis not present

## 2020-07-14 DIAGNOSIS — H2513 Age-related nuclear cataract, bilateral: Secondary | ICD-10-CM | POA: Diagnosis not present

## 2020-07-14 DIAGNOSIS — H35033 Hypertensive retinopathy, bilateral: Secondary | ICD-10-CM | POA: Diagnosis not present

## 2020-07-14 DIAGNOSIS — H43393 Other vitreous opacities, bilateral: Secondary | ICD-10-CM | POA: Diagnosis not present

## 2020-07-14 DIAGNOSIS — E119 Type 2 diabetes mellitus without complications: Secondary | ICD-10-CM | POA: Diagnosis not present

## 2020-07-29 DIAGNOSIS — Z1231 Encounter for screening mammogram for malignant neoplasm of breast: Secondary | ICD-10-CM | POA: Diagnosis not present

## 2020-08-02 ENCOUNTER — Ambulatory Visit (HOSPITAL_COMMUNITY)
Admission: EM | Admit: 2020-08-02 | Discharge: 2020-08-02 | Disposition: A | Discharge status: Home / Self Care | Payer: BC Managed Care – PPO | Attending: Family Medicine | Admitting: Family Medicine

## 2020-08-02 ENCOUNTER — Encounter (HOSPITAL_COMMUNITY): Payer: Self-pay | Admitting: Emergency Medicine

## 2020-08-02 ENCOUNTER — Other Ambulatory Visit: Payer: Self-pay

## 2020-08-02 DIAGNOSIS — M79605 Pain in left leg: Secondary | ICD-10-CM

## 2020-08-02 DIAGNOSIS — M549 Dorsalgia, unspecified: Secondary | ICD-10-CM

## 2020-08-02 DIAGNOSIS — M5432 Sciatica, left side: Secondary | ICD-10-CM

## 2020-08-02 HISTORY — DX: Essential (primary) hypertension: I10

## 2020-08-02 HISTORY — DX: Type 2 diabetes mellitus without complications: E11.9

## 2020-08-02 LAB — POCT URINALYSIS DIPSTICK, ED / UC
Bilirubin Urine: NEGATIVE
Glucose, UA: NEGATIVE mg/dL
Hgb urine dipstick: NEGATIVE
Ketones, ur: NEGATIVE mg/dL
Nitrite: NEGATIVE
Protein, ur: NEGATIVE mg/dL
Specific Gravity, Urine: 1.02 (ref 1.005–1.030)
Urobilinogen, UA: 0.2 mg/dL (ref 0.0–1.0)
pH: 6.5 (ref 5.0–8.0)

## 2020-08-02 MED ORDER — PREDNISONE 10 MG (21) PO TBPK: ORAL_TABLET | ORAL | 0 refills | Status: AC

## 2020-08-02 MED ORDER — CYCLOBENZAPRINE HCL 10 MG PO TABS: 5.0000 mg | ORAL_TABLET | Freq: Every day | ORAL | 0 refills | Status: AC

## 2020-08-02 NOTE — ED Triage Notes (Addendum)
Pt presents to [facility] for assessment of left leg pain and left lower back pain starting 1 week ago, worsening each day, and patient states it woke her up from her sleep this morning.  Pt c/o the pain feeling like a squeezing.

## 2020-08-02 NOTE — Discharge Instructions (Addendum)
Treating you for sciatic nerve pain Prednisone taper over the next 6 days.  Take this with food. Flexeril as needed at bedtime.  You can take up to 10 mg as needed Follow up as needed for continued or worsening symptoms

## 2020-08-03 NOTE — ED Provider Notes (Signed)
[facility]    CSN: [id number] Arrival date & time: 08/02/20  1018      History   Chief Complaint Chief Complaint  Patient presents with  . Back Pain  . Leg Pain    HPI Jacqueline Pollard is a 54 y.o. female.   Patient is a 54 year old female who presents today with left lower back pain that radiates down the left leg into left foot.  She has had some associated numbness and tingling.  Reporting this started approximate 1 week ago and worsening day by day.  Pain woke her up from her sleep this morning.  Patient is worse with ambulation and lifting of the left leg.  Denies any dysuria, hematuria, urinary frequency, loss of bowel or bladder function.  No falls or injuries.  ROS per HPI      Past Medical History:  Diagnosis Date  . Diabetes mellitus without complication (HCC)   . Hypertension     There are no problems to display for this patient.   History reviewed. No pertinent surgical history.  OB History   No obstetric history on file.      Home Medications    Prior to Admission medications   Medication Sig Start Date End Date Taking? Authorizing Provider  amLODipine (NORVASC) 10 MG tablet Take 10 mg by mouth daily.   Yes [provider]  olmesartan-hydrochlorothiazide (BENICAR HCT) 40-25 MG tablet Take 1 tablet by mouth daily.   Yes [provider]  simvastatin (ZOCOR) 10 MG tablet Take 10 mg by mouth daily.   Yes [provider]  albuterol (PROVENTIL HFA;VENTOLIN HFA) 108 (90 BASE) MCG/ACT inhaler Inhale 2 puffs into the lungs every 4 (four) hours as needed for wheezing. 06/06/14   Excell Seltzer, Adrian Blackwater, PA-C  cyclobenzaprine (FLEXERIL) 10 MG tablet Take 0.5 tablets (5 mg total) by mouth at bedtime. 08/02/20   Dahlia Byes A, NP  metFORMIN (GLUCOPHAGE) 500 MG tablet Take 500 mg by mouth daily with breakfast.    [provider]  omeprazole (PRILOSEC) 40 MG capsule Take 1 capsule (40 mg total) by mouth daily. 06/23/14   Rodolph Bong, MD  predniSONE (STERAPRED UNI-PAK 21 TAB) 10 MG (21) TBPK tablet 6 tabs for 1 day, then 5 tabs for 1 das, then 4 tabs for 1 day, then 3 tabs for 1 day, 2 tabs for 1 day, then 1 tab for 1 day 08/02/20   Janace Aris, NP  Spacer/Aero-Holding Chambers (AEROCHAMBER PLUS FLO-VU LARGE) MISC 1 each by Other route once. 06/06/14   Graylon Good, PA-C    Family History Family History  Family history unknown: Yes    Social History Social History   Tobacco Use  . Smoking status: Passive Smoke Exposure - Never Smoker  . Smokeless tobacco: Never Used  Substance Use Topics  . Alcohol use: No  . Drug use: Never     Allergies   Patient has no known allergies.   Review of Systems Review of Systems   Physical Exam Triage Vital Signs ED Triage Vitals  Enc Vitals Group     BP 08/02/20 1207 (!) 155/74     Pulse Rate 08/02/20 1207 74     Resp 08/02/20 1207 18     Temp 08/02/20 1207 98.3 F (36.8 C)     Temp Source 08/02/20 1207 Oral     SpO2 08/02/20 1207 97 %     Weight --      Height --  Head Circumference --      Peak Flow --      Pain Score 08/02/20 1208 5     Pain Loc --      Pain Edu? --      Excl. in GC? --    No data found.  Updated Vital Signs BP (!) 155/74 (BP Location: Right Arm)   Pulse 74   Temp 98.3 F (36.8 C) (Oral)   Resp 18   LMP 06/02/2014   SpO2 97%   Visual Acuity Right Eye Distance:   Left Eye Distance:   Bilateral Distance:    Right Eye Near:   Left Eye Near:    Bilateral Near:     Physical Exam Vitals and nursing note reviewed.  Constitutional:      General: She is not in acute distress.    Appearance: Normal appearance. She is not ill-appearing, toxic-appearing or diaphoretic.  HENT:     Head: Normocephalic.     Nose: Nose normal.  Eyes:     Conjunctiva/sclera: Conjunctivae normal.  Pulmonary:     Effort: Pulmonary effort is normal.  Musculoskeletal:     Cervical back: Normal range of motion.     Lumbar back:  Tenderness present. No bony tenderness. Decreased range of motion. Positive left straight leg raise test.       Back:  Skin:    General: Skin is warm and dry.     Findings: No rash.  Neurological:     Mental Status: She is alert.  Psychiatric:        Mood and Affect: Mood normal.      UC Treatments / Results  Labs (all labs ordered are listed, but only abnormal results are displayed) Labs Reviewed  POCT URINALYSIS DIPSTICK, ED / UC - Abnormal; Notable for the following components:      Result Value   Leukocytes,Ua TRACE (*)    All other components within normal limits    EKG   Radiology No results found.  Procedures Procedures (including critical care time)  Medications Ordered in UC Medications - No data to display  Initial Impression / Assessment and Plan / UC Course  I have reviewed the triage vital signs and the nursing notes.  Pertinent labs & imaging results that were available during my care of the patient were reviewed by me and considered in my medical decision making (see chart for details).     Sciatica of left side. Urine with trace leuks sending for culture.  Not convinced of urinary tract infection at this time. Will treat with prednisone taper over the next 6 days.   Flexeril at bedtime for muscle relaxant and help rest. Follow up as needed for continued or worsening symptoms  Final Clinical Impressions(s) / UC Diagnoses   Final diagnoses:  Sciatica of left side     Discharge Instructions     Treating you for sciatic nerve pain Prednisone taper over the next 6 days.  Take this with food. Flexeril as needed at bedtime.  You can take up to 10 mg as needed Follow up as needed for continued or worsening symptoms     ED Prescriptions    Medication Sig Dispense Auth. Provider   predniSONE (STERAPRED UNI-PAK 21 TAB) 10 MG (21) TBPK tablet 6 tabs for 1 day, then 5 tabs for 1 das, then 4 tabs for 1 day, then 3 tabs for 1 day, 2 tabs for 1 day,  then 1 tab for 1 day 21 tablet  Dahlia Byes A, NP   cyclobenzaprine (FLEXERIL) 10 MG tablet Take 0.5 tablets (5 mg total) by mouth at bedtime. 20 tablet Dahlia Byes A, NP     PDMP not reviewed this encounter.   Janace Aris, NP 08/03/20 1202

## 2020-09-08 DIAGNOSIS — Z0000 Encounter for general adult medical examination without abnormal findings: Secondary | ICD-10-CM | POA: Diagnosis not present

## 2021-08-15 DIAGNOSIS — Z1231 Encounter for screening mammogram for malignant neoplasm of breast: Secondary | ICD-10-CM | POA: Diagnosis not present

## 2021-09-14 DIAGNOSIS — Z01419 Encounter for gynecological examination (general) (routine) without abnormal findings: Secondary | ICD-10-CM | POA: Diagnosis not present

## 2021-10-22 DIAGNOSIS — H43393 Other vitreous opacities, bilateral: Secondary | ICD-10-CM | POA: Diagnosis not present

## 2021-10-22 DIAGNOSIS — H2513 Age-related nuclear cataract, bilateral: Secondary | ICD-10-CM | POA: Diagnosis not present

## 2021-10-22 DIAGNOSIS — H35013 Changes in retinal vascular appearance, bilateral: Secondary | ICD-10-CM | POA: Diagnosis not present

## 2021-10-22 DIAGNOSIS — E119 Type 2 diabetes mellitus without complications: Secondary | ICD-10-CM | POA: Diagnosis not present

## 2022-07-26 DIAGNOSIS — I10 Essential (primary) hypertension: Secondary | ICD-10-CM | POA: Diagnosis not present

## 2022-07-26 DIAGNOSIS — E1169 Type 2 diabetes mellitus with other specified complication: Secondary | ICD-10-CM | POA: Diagnosis not present

## 2022-09-13 DIAGNOSIS — E1169 Type 2 diabetes mellitus with other specified complication: Secondary | ICD-10-CM | POA: Diagnosis not present

## 2022-09-13 DIAGNOSIS — Z1231 Encounter for screening mammogram for malignant neoplasm of breast: Secondary | ICD-10-CM | POA: Diagnosis not present

## 2022-09-13 DIAGNOSIS — Z0000 Encounter for general adult medical examination without abnormal findings: Secondary | ICD-10-CM | POA: Diagnosis not present

## 2022-09-20 DIAGNOSIS — Z01419 Encounter for gynecological examination (general) (routine) without abnormal findings: Secondary | ICD-10-CM | POA: Diagnosis not present

## 2022-10-25 DIAGNOSIS — H524 Presbyopia: Secondary | ICD-10-CM | POA: Diagnosis not present

## 2022-10-25 DIAGNOSIS — E119 Type 2 diabetes mellitus without complications: Secondary | ICD-10-CM | POA: Diagnosis not present

## 2022-10-25 DIAGNOSIS — H43393 Other vitreous opacities, bilateral: Secondary | ICD-10-CM | POA: Diagnosis not present

## 2022-10-25 DIAGNOSIS — H0102A Squamous blepharitis right eye, upper and lower eyelids: Secondary | ICD-10-CM | POA: Diagnosis not present

## 2022-10-25 DIAGNOSIS — H2513 Age-related nuclear cataract, bilateral: Secondary | ICD-10-CM | POA: Diagnosis not present

## 2023-09-26 DIAGNOSIS — Z1231 Encounter for screening mammogram for malignant neoplasm of breast: Secondary | ICD-10-CM | POA: Diagnosis not present

## 2023-09-26 DIAGNOSIS — N898 Other specified noninflammatory disorders of vagina: Secondary | ICD-10-CM | POA: Diagnosis not present

## 2023-09-26 DIAGNOSIS — Z01419 Encounter for gynecological examination (general) (routine) without abnormal findings: Secondary | ICD-10-CM | POA: Diagnosis not present

## 2023-10-03 DIAGNOSIS — E1169 Type 2 diabetes mellitus with other specified complication: Secondary | ICD-10-CM | POA: Diagnosis not present

## 2023-10-03 DIAGNOSIS — Z23 Encounter for immunization: Secondary | ICD-10-CM | POA: Diagnosis not present

## 2023-10-03 DIAGNOSIS — I10 Essential (primary) hypertension: Secondary | ICD-10-CM | POA: Diagnosis not present

## 2023-10-03 DIAGNOSIS — Z0000 Encounter for general adult medical examination without abnormal findings: Secondary | ICD-10-CM | POA: Diagnosis not present

## 2023-11-07 DIAGNOSIS — I10 Essential (primary) hypertension: Secondary | ICD-10-CM | POA: Diagnosis not present

## 2023-11-07 DIAGNOSIS — Z6835 Body mass index (BMI) 35.0-35.9, adult: Secondary | ICD-10-CM | POA: Diagnosis not present

## 2023-11-07 DIAGNOSIS — E2839 Other primary ovarian failure: Secondary | ICD-10-CM | POA: Diagnosis not present

## 2023-11-07 DIAGNOSIS — E1169 Type 2 diabetes mellitus with other specified complication: Secondary | ICD-10-CM | POA: Diagnosis not present

## 2023-11-14 DIAGNOSIS — H02831 Dermatochalasis of right upper eyelid: Secondary | ICD-10-CM | POA: Diagnosis not present

## 2023-11-14 DIAGNOSIS — H02834 Dermatochalasis of left upper eyelid: Secondary | ICD-10-CM | POA: Diagnosis not present

## 2023-11-14 DIAGNOSIS — H2513 Age-related nuclear cataract, bilateral: Secondary | ICD-10-CM | POA: Diagnosis not present

## 2023-11-14 DIAGNOSIS — H524 Presbyopia: Secondary | ICD-10-CM | POA: Diagnosis not present

## 2023-11-14 DIAGNOSIS — E119 Type 2 diabetes mellitus without complications: Secondary | ICD-10-CM | POA: Diagnosis not present
